# Patient Record
Sex: Female | Born: 1970 | Race: White | Hispanic: No | Marital: Married | Smoking: Never smoker
Health system: Southern US, Community
[De-identification: ages and names within clinical notes are randomized; demographics above are authoritative.]

## PROBLEM LIST (undated history)

## (undated) DIAGNOSIS — F329 Major depressive disorder, single episode, unspecified: Secondary | ICD-10-CM

## (undated) DIAGNOSIS — N921 Excessive and frequent menstruation with irregular cycle: Secondary | ICD-10-CM

## (undated) DIAGNOSIS — E559 Vitamin D deficiency, unspecified: Secondary | ICD-10-CM

## (undated) DIAGNOSIS — N946 Dysmenorrhea, unspecified: Secondary | ICD-10-CM

## (undated) DIAGNOSIS — N926 Irregular menstruation, unspecified: Secondary | ICD-10-CM

## (undated) DIAGNOSIS — R896 Abnormal cytological findings in specimens from other organs, systems and tissues: Principal | ICD-10-CM

## (undated) HISTORY — DX: Irregular menstruation, unspecified: N92.6

## (undated) HISTORY — DX: Abnormal cytological findings in specimens from other organs, systems and tissues: R89.6

## (undated) HISTORY — PX: WISDOM TOOTH EXTRACTION: SHX21

## (undated) HISTORY — DX: Vitamin D deficiency, unspecified: E55.9

## (undated) HISTORY — PX: TONSILLECTOMY: SUR1361

## (undated) HISTORY — DX: Dysmenorrhea, unspecified: N94.6

## (undated) HISTORY — DX: Major depressive disorder, single episode, unspecified: F32.9

## (undated) HISTORY — DX: Excessive and frequent menstruation with irregular cycle: N92.1

---

## 1998-09-06 ENCOUNTER — Other Ambulatory Visit: Admission: RE | Admit: 1998-09-06 | Discharge: 1998-09-06 | Payer: Self-pay | Admitting: *Deleted

## 1999-02-22 ENCOUNTER — Other Ambulatory Visit: Admission: RE | Admit: 1999-02-22 | Discharge: 1999-02-22 | Payer: Self-pay | Admitting: Obstetrics and Gynecology

## 1999-08-28 ENCOUNTER — Observation Stay (HOSPITAL_COMMUNITY): Admission: AD | Admit: 1999-08-28 | Discharge: 1999-08-29 | Payer: Self-pay | Admitting: *Deleted

## 1999-09-23 ENCOUNTER — Inpatient Hospital Stay (HOSPITAL_COMMUNITY): Admission: AD | Admit: 1999-09-23 | Discharge: 1999-09-26 | Payer: Self-pay | Admitting: Obstetrics and Gynecology

## 2000-03-01 ENCOUNTER — Other Ambulatory Visit: Admission: RE | Admit: 2000-03-01 | Discharge: 2000-03-01 | Payer: Self-pay | Admitting: Obstetrics and Gynecology

## 2001-03-21 ENCOUNTER — Other Ambulatory Visit: Admission: RE | Admit: 2001-03-21 | Discharge: 2001-03-21 | Payer: Self-pay | Admitting: Obstetrics and Gynecology

## 2001-10-02 ENCOUNTER — Inpatient Hospital Stay (HOSPITAL_COMMUNITY): Admission: AD | Admit: 2001-10-02 | Discharge: 2001-10-04 | Payer: Self-pay | Admitting: Obstetrics and Gynecology

## 2002-03-27 ENCOUNTER — Other Ambulatory Visit: Admission: RE | Admit: 2002-03-27 | Discharge: 2002-03-27 | Payer: Self-pay | Admitting: Obstetrics and Gynecology

## 2002-11-11 ENCOUNTER — Encounter: Admission: RE | Admit: 2002-11-11 | Discharge: 2002-11-11 | Payer: Self-pay | Admitting: Internal Medicine

## 2002-11-11 ENCOUNTER — Encounter: Payer: Self-pay | Admitting: Internal Medicine

## 2002-12-23 ENCOUNTER — Encounter: Payer: Self-pay | Admitting: Internal Medicine

## 2002-12-23 ENCOUNTER — Encounter: Admission: RE | Admit: 2002-12-23 | Discharge: 2002-12-23 | Payer: Self-pay | Admitting: Internal Medicine

## 2003-08-13 ENCOUNTER — Other Ambulatory Visit: Admission: RE | Admit: 2003-08-13 | Discharge: 2003-08-13 | Payer: Self-pay | Admitting: *Deleted

## 2003-08-13 ENCOUNTER — Other Ambulatory Visit: Admission: RE | Admit: 2003-08-13 | Discharge: 2003-08-13 | Payer: Self-pay | Admitting: Obstetrics and Gynecology

## 2004-02-19 ENCOUNTER — Inpatient Hospital Stay (HOSPITAL_COMMUNITY): Admission: AD | Admit: 2004-02-19 | Discharge: 2004-02-21 | Payer: Self-pay | Admitting: Obstetrics and Gynecology

## 2005-03-13 DIAGNOSIS — F32A Depression, unspecified: Secondary | ICD-10-CM

## 2005-03-13 DIAGNOSIS — IMO0001 Reserved for inherently not codable concepts without codable children: Secondary | ICD-10-CM

## 2005-03-13 DIAGNOSIS — N921 Excessive and frequent menstruation with irregular cycle: Secondary | ICD-10-CM

## 2005-03-13 HISTORY — DX: Excessive and frequent menstruation with irregular cycle: N92.1

## 2005-03-13 HISTORY — DX: Reserved for inherently not codable concepts without codable children: IMO0001

## 2005-03-13 HISTORY — DX: Depression, unspecified: F32.A

## 2005-05-10 ENCOUNTER — Other Ambulatory Visit: Admission: RE | Admit: 2005-05-10 | Discharge: 2005-05-10 | Payer: Self-pay | Admitting: Obstetrics and Gynecology

## 2006-03-13 DIAGNOSIS — E559 Vitamin D deficiency, unspecified: Secondary | ICD-10-CM

## 2006-03-13 HISTORY — DX: Vitamin D deficiency, unspecified: E55.9

## 2006-05-28 ENCOUNTER — Ambulatory Visit: Payer: Self-pay | Admitting: Family Medicine

## 2006-05-30 ENCOUNTER — Ambulatory Visit: Payer: Self-pay | Admitting: Family Medicine

## 2006-05-30 LAB — CONVERTED CEMR LAB
ALT: 13 units/L (ref 0–40)
AST: 22 units/L (ref 0–37)
Albumin: 3.8 g/dL (ref 3.5–5.2)
Alkaline Phosphatase: 46 units/L (ref 39–117)
BUN: 14 mg/dL (ref 6–23)
Basophils Absolute: 0.1 10*3/uL (ref 0.0–0.1)
Basophils Relative: 1.2 % — ABNORMAL HIGH (ref 0.0–1.0)
Bilirubin, Direct: 0.1 mg/dL (ref 0.0–0.3)
CO2: 29 meq/L (ref 19–32)
Calcium: 9 mg/dL (ref 8.4–10.5)
Chloride: 106 meq/L (ref 96–112)
Creatinine, Ser: 0.8 mg/dL (ref 0.4–1.2)
Eosinophils Absolute: 0.1 10*3/uL (ref 0.0–0.6)
Eosinophils Relative: 1.4 % (ref 0.0–5.0)
Free T4: 0.5 ng/dL — ABNORMAL LOW (ref 0.6–1.6)
GFR calc Af Amer: 106 mL/min
GFR calc non Af Amer: 87 mL/min
Glucose, Bld: 96 mg/dL (ref 70–99)
HCT: 36.1 % (ref 36.0–46.0)
Hemoglobin: 12.8 g/dL (ref 12.0–15.0)
Lymphocytes Relative: 22.6 % (ref 12.0–46.0)
MCHC: 35.5 g/dL (ref 30.0–36.0)
MCV: 88.5 fL (ref 78.0–100.0)
Monocytes Absolute: 0.5 10*3/uL (ref 0.2–0.7)
Monocytes Relative: 10.6 % (ref 3.0–11.0)
Neutro Abs: 3.1 10*3/uL (ref 1.4–7.7)
Neutrophils Relative %: 64.2 % (ref 43.0–77.0)
Platelets: 258 10*3/uL (ref 150–400)
Potassium: 4.5 meq/L (ref 3.5–5.1)
RBC: 4.08 M/uL (ref 3.87–5.11)
RDW: 11.7 % (ref 11.5–14.6)
Sodium: 141 meq/L (ref 135–145)
T3, Free: 3.5 pg/mL (ref 2.3–4.2)
TSH: 0.89 microintl units/mL (ref 0.35–5.50)
Total Bilirubin: 0.7 mg/dL (ref 0.3–1.2)
Total Protein: 7.1 g/dL (ref 6.0–8.3)
WBC: 4.9 10*3/uL (ref 4.5–10.5)

## 2008-03-13 DIAGNOSIS — N926 Irregular menstruation, unspecified: Secondary | ICD-10-CM

## 2008-03-13 HISTORY — DX: Irregular menstruation, unspecified: N92.6

## 2008-04-09 ENCOUNTER — Encounter: Admission: RE | Admit: 2008-04-09 | Discharge: 2008-04-09 | Payer: Self-pay | Admitting: Obstetrics and Gynecology

## 2009-03-13 DIAGNOSIS — N946 Dysmenorrhea, unspecified: Secondary | ICD-10-CM

## 2009-03-13 HISTORY — DX: Dysmenorrhea, unspecified: N94.6

## 2010-07-29 NOTE — H&P (Signed)
[facility]  Patient:    Lindsay Bradley, Lindsay Bradley                     MRN: [id number] Attending:  Georgina Peer, M.D. Dictator:   Wynelle Bourgeois, C.N.M.                         History and Physical  HISTORY OF PRESENT ILLNESS:   Lindsay Bradley is a 40 year old married white female at 35-6/7 weeks who presents this morning with complaint of painful uterine contractions since 4 p.m.  She denies rupture of membranes or bleeding and reports positive fetal movement.  She has been followed by the C.N.M. service at [facility] since [redacted] weeks gestation.  Her pregnancy has been remarkable for:  #1 - Long cycles, #2 - family history of ovarian cancer, #3 - preterm labor, #4 - GBS negative.  OBSTETRICAL HISTORY:          Unremarkable.  OBSTETRICAL LABORATORY DATA:  Hemoglobin 12.3, hematocrit 35.7, platelets 321,000.  Blood type O-positive.  Antibody screen negative.  RPR nonreactive. Rubella titer immune.  HBsAg negative.  Pap test within normal limits.  MSAFP within normal limits.  Glucose challenge 143.  Three-hour glucose tolerance test was within normal limits and group B strep was negative.  MEDICAL HISTORY:              Remarkable for history of long menstrual cycles.  SURGICAL HISTORY:             Remarkable for tonsillectomy and wisdom teeth extraction in 1992.  FAMILY HISTORY:               Remarkable for patients father with heart disease, hypertension and adult-onset diabetes, controlled with oral medications.  Mother with ovarian cancer, treated with chemotherapy.  Paternal aunt with breast cancer.  Maternal uncle with bipolar and manic depression, on medications.  GENETIC HISTORY:              Patients father is color blind; otherwise, genetic history is unremarkable.  SOCIAL HISTORY:               Patient is married to [name], who is involved and supportive.  They are of the [employer].  She works as a Human resources officer.  She denies any  alcohol, tobacco or illicit drug use.  PHYSICAL EXAMINATION:  VITAL SIGNS:                  Vital signs are stable.  Patient is afebrile. Blood pressure is 136/85.  SKIN:                         Within normal limits.  HEENT:                        Within normal limits.  NECK:                         Thyroid normal, not enlarged.  No masses.  CHEST:                        Clear to auscultation bilaterally.  HEART:                        Regular rate and rhythm.  No murmur.  ABDOMEN:  Soft, nontender.  Gravid at 36 cm.  Vertex to [place].  EFW is approximately 6 to 6-1/2 pounds.  Fetal monitoring reveals fetal heart tones in 120s to 130s, with average variability, positive accelerations, no decelerations.  Uterine contractions are noted at every two to four minutes, moderate to strong in intensity.  PELVIC:                       Cervical exam is 2 cm, 60-70% effaced and 0 to -1 station with a vertex presentation and a bulging bag of waters.  EXTREMITIES:                  Within normal limits with a negative Homans.  ASSESSMENT:                   1. Intrauterine pregnancy at 35-6/7 weeks.                               2. Group B streptococcus negative.                               3. Preterm labor with cervical change.  PLAN:                         1. Dr. Georgina Peer was consulted.                               2. Admit to birthing suites for labor.                               3. Routine C.N.M. orders.                               4. Expectant management.                               5. NICU aware of admission and approves                                  admission. DD:  08/29/99 TD:  08/29/99 Job: [id number]

## 2010-07-29 NOTE — Discharge Summary (Signed)
[facility] of [facility]  Patient:    Lindsay Bradley, Lindsay Bradley                     MRN: [id number] Adm. Date:  [id number] Disc. Date: [id number] Attending:  Pleas Koch Dictator:   Nigel Bridgeman, C.N.M.                           Discharge Summary  ADMITTING DIAGNOSES:          1. Intrauterine pregnancy at 35-6/7 weeks.                               2. Questionable labor.  DISCHARGE DIAGNOSES:          1. Intrauterine pregnancy at 3-6/7 weeks.                               2. Prodromal labor.  PROCEDURE:                    Electronic fetal monitoring.  HOSPITAL COURSE:              The patient is a 40 year old gravida 1, para 0 at 35-6/7 weeks who presented to the hospital right after midnight on August 29, 1999 with uterine contractions since 4;00 p.m. yesterday. SHe denies any leaking or bleeding and reported positive fetal movements.  She has been followed at [facility] by the [facility] service.  Her pregnancy has been remarkable for 1) long cycles, 2) family history of ovarian cysts, 3) beta Strep negative and 4) preterm gestation.  On admission, the cervix was 2, 60% to 70%, vertex at -1 to 0 station with bulging bag of water.  Uterine contractions were every 2-4 minutes, moderate to strong in quality.  Fetal heart rate was reactive.  The patient was admitted for labor care.  Throughout the rest of the night, her contractions remained every 2-4 minutes.  She denied any need for pain medication.  Fetal heart rate remained reactive.  The patient was reevaluated at approximately 4:30 a.m.  Contractions were beginning to space out slightly.  The cervix was 3+, 70% to 80%, vertex at -1 to 0 station with some ______ noted.  The patients contractions then spaced out to every 5-8 minutes.  The cervix was unchanged.  Plan was made to evaluate the patient after ambulation.  Fetal heart rate was reactive during the time.  Vital signs were stable.  The patient was  afebrile.  I reviewed with the patient the issue of no recommendation for augmentation if the cervical dilatation did not advance.  The patient was agreeable with that plan.  After approximately 2-3 hours of ambulation, the patient was reevaluated.  The cervix was unchanged.  Uterine contractions were now every 9-10 minutes with resting.  They were more frequent slightly with walking. Fetal heart rate was reactive.  I reviewed with the patient the issue of lack of cervical change and probable prodromal labor.  I also discussed with her the recommendation against augmentation or artificial rupture of membranes, given her 35-6/[redacted] weeks gestation.  The patient and her husband were agreeable with the plan.  The patient was discharged to home with Ambien for therapeutic sedation.  DISCHARGE INSTRUCTIONS:       Per antenatal discharge handout.  DISCHARGE MEDICATIONS:  Ambien 10 mg, one p.o. p.r.n.  DISCHARGE FOLLOW UP:          Per increased labor status or rupture of membranes or p.r.n. DD:  08/29/99 TD:  08/31/99 Job: [id number]

## 2010-07-29 NOTE — Discharge Summary (Signed)
[facility] of [facility]  Patient:    Lindsay Bradley, Lindsay Bradley                     MRN: [id number] Adm. Date:  [id number] Disc. Date: 09/26/99 Attending:  Shaune Spittle Dictator:   Vance Gather Duplantis, C.N.M.                           Discharge Summary  ADMISSION DIAGNOSES:          1. Intrauterine pregnancy at term.                               2. Early active labor.                               3. Negative group B strep.  DISCHARGE DIAGNOSES:          1. Intrauterine pregnancy at term.                               2. Early active labor.                               3. Negative group B strep.                               4. Protracted second stage, status post vacuum                                  extraction-assisted delivery.                               5. Breast feeding.                               6. Desires oral contraceptives.  PROCEDURES THIS ADMISSION:    Vacuum-assisted vaginal delivery of a viable female infant named Lindsay Bradley who weighed 7 pounds 7 ounces and had Apgars of 9 and 9 on September 24, 1999 by Dr. Dierdre Forth.  HOSPITAL COURSE:              [name] is a 40 year old married white female gravida 1 para 0 at 86 and three-sevenths weeks who was admitted in early active labor and progressed steadily to complete at approximately 8:45 p.m.  At that point, she pushed for approximately two hours with minimal descent and was recommended to obtain an epidural bolus and rest, which she did for about an hour, at which point her pressure and desire to push had returned, and she pushed again for several hours.  Following approximately three hours more of pushing, she had brought the vertex to a +3 station, but at this point was exhausted and unable to continue pushing actively with effecting any descent.  She was agreeable to have Dr. Dierdre Forth be consulted regarding an assisted vaginal delivery versus C section, and Dr. Dierdre Forth came and evaluated the patient, and offered a vacuum-assisted delivery versus C section, and they elected to proceed  with a vacuum-assisted delivery.  She was then delivered of a viable female infant named Lindsay Bradley, who weighed 7 pounds 7 ounces and had Apgars of 9 and 9, with Dr. Dierdre Forth attending the delivery.  Please see Delivery Note for details.  Postpartum, she has done well.  She is ambulating, voiding, and eating without difficulty.  She is breast feeding, also, without difficulty. Her vital signs are stable, and she is afebrile.  She desired oral contraceptives for birth control.  She is deemed ready for discharge today.  DISCHARGE INSTRUCTIONS:       As per the [facility] OB/GYN handout.  DISCHARGE MEDICATIONS:        1. Motrin 600 mg p.o. q.6h. p.r.n. pain.                               2. Tylenol No. 3 1-2 p.o. q.4-6h. p.r.n. pain.                               3. Micronor 1 p.o. q.d.                               4. Prenatal vitamins daily.                               5. Ferrous sulfate daily.                               6. Colace b.i.d.  DISCHARGE LABORATORY:         Hemoglobin is 8.5, her platelets are 144, and her wbc count is 8.8.  DISCHARGE FOLLOW-UP:           In six weeks at [facility] or p.r.n. DD:  09/26/99 TD:  09/26/99 Job: [id number]

## 2010-07-29 NOTE — H&P (Signed)
NAMEYEKATERINA, ESCUTIA            ACCOUNT NO.:  [id number]   MEDICAL RECORD NO.:  [id number]          PATIENT TYPE:  INP   LOCATION:  9169                          FACILITY:  [facility]   PHYSICIAN:  Hal Morales, M.D.DATE OF BIRTH:  26-Jan-1971   DATE OF ADMISSION:  02/19/2004  DATE OF DISCHARGE:                                HISTORY & PHYSICAL   HISTORY OF PRESENT ILLNESS:  Ms. Neiswonger is a 40 year old, gravida 3, para  2-0-0-2 at 37-1/7th weeks, who presented with uterine contractions every  three to four minutes since approximately 2 p.m.  The cervix has been 3 cm  in the office.  She reports positive fetal movement.  Beta strep is pending  as of February 18, 2004 performance.   Pregnancy has been remarkable for family history of ovarian cancer, long  cycles, history of back pains in the past.   PRENATAL LABORATORY DATA:  Blood type is O positive.  Rh antibody negative.  VDRL nonreactive.  Rubella titer positive.  Hepatitis B surface-antigen  negative.  HIV nonreactive.  Cystic fibrosis testing negative.  GC and  Chlamydia cultures were negative in the first trimester.  Pap was normal.  GC, Chlamydia, and group B strep cultures were performed on February 18, 2004  but cultures are still pending.  Hemoglobin upon entering the practice was  12.8.  It was within normal limits at 28 weeks.   PREGNANCY HISTORY:  The patient entered care at approximately 10 weeks.  She  had history of back pain and sacroiliac joint problems in the past.  [facility]  for referral was considered but was deferred at present per patient's  request.  The patient had an ultrasound at 11 weeks for dating purposes  which gave an [facility] of March 10, 2004.  She declined quadruple screen.  She  had lots of nausea through her pregnancy.  Reglan was initiated.  The  patient also had Ambien prescribed secondary to short-term insomnia.  She  had another ultrasound at 18 weeks showing a partial placenta previa.  All  other  findings were normal.  At 22 weeks, she had noted ultrasound in which  previa was resolved.  She had a normal Glucola.  The rest of her pregnancy  was essentially uncomplicated.   OBSTETRICAL HISTORY:  In 2001, she had a vaginal vacuum-assisted vaginal  birth of a female infant, weight 7 pounds 7 ounces at 39 weeks.  She was in  labor 13 hours.  She had epidural anesthesia.  She had a prolonged second  stage and a third degree laceration.   In 2003, she had a vaginal birth of a female infant, weighed 8 pounds at 37-  5/7th weeks.  She was in labor six hours.  She had epidural anesthesia.   PAST MEDICAL HISTORY:  She is a previous __________ user.  She reports the  usual childhood illnesses.  She had a history of back pain before pregnancy  and during pregnancy.  Her only other hospitalization was for childbirth x  2.   PAST SURGICAL HISTORY:  Tonsils and wisdom teeth removed in the past.  ALLERGIES:  She is allergic to SULFA.   FAMILY HISTORY:  Her father had heart disease and hypertension.  Her father  is a diabetic on oral medication.  Her mother has had ovarian cancer x 2,  currently in remission.   GENETIC HISTORY:  Unremarkable.   SOCIAL HISTORY:  The patient is married to the father of the baby.  He is  involved and supportive.  His name is Danyla Wattley.  The patient has a  graduate school education.  She is a Futures trader.  Her husband is also  graduate educated.  She has been followed by the Certified Nurse [facility].  She denies any alcohol, drug, or tobacco use during  this pregnancy.   PHYSICAL EXAMINATION:  VITAL SIGNS:  Stable.  The patient is afebrile.  HEENT:  Within normal limits.  LUNGS:  Clear.  HEART:  Regular rate and rhythm without murmur.  BREASTS:  Soft and nontender.  ABDOMEN:  Fundal height is approximately 37 cm.  Estimated fetal weight is 7  pounds.  Uterine contractions are every 3 minutes, 60 seconds in duration,  moderate quality.   PELVIC:  Cervix is 4 to 5 cm, 80% vertex, and a -1 station.  Bulging bag of  water.  EXTREMITIES:  Deep tendon reflexes are 2+ without clonus.  There is a trace  edema noted.   IMPRESSION:  1.  Intrauterine pregnancy at 37-1/7th weeks.  2.  Active labor.  3.  Group B strep cultures pending from February 18, 2004.   PLAN:  1.  Admit to birthing suite per consult with Dr. Hal Morales as      attending physician.  2.  Routine certified nurse midwife orders.  3.  We will plan group B strep prophylaxis with ampicillin for prophylaxis      after reviewing status with the patient.  4.  Epidural p.r.n.     Vick   VLL/MEDQ  D:  02/19/2004  T:  02/19/2004  Job:  [id number]

## 2010-07-29 NOTE — Op Note (Signed)
[facility] of [facility]  Patient:    Lindsay Bradley, Lindsay Bradley                     MRN: [id number] Proc. Date: 09/24/99 Adm. Date:  [id number] Attending:  Dierdre Forth [place]                           Operative Report  PREOPERATIVE DIAGNOSIS:       Intrauterine pregnancy at term, prolonged second stage of labor.  POSTOPERATIVE DIAGNOSIS:      Intrauterine pregnancy at term, prolonged second stage of labor.  Nuchal cord x 1.  OPERATION:                    Vacuum-assisted vaginal delivery over intact perineum and repair of left vaginal sulcus laceration, repair of third degree perineal laceration.  SURGEON:                      Vanessa P. Haygood, M.D.  ASSISTANT:  ANESTHESIA:                   Epidural.  ESTIMATED BLOOD LOSS:         750 cc.  COMPLICATIONS:                None.  FINDINGS:                     The patient was delivered of a female infant whose ame is Lane with Apgars of 9 and 9 at one and five minutes, respectively.  The weight was pending at the time of dictation.  DESCRIPTION OF PROCEDURE:     The patient had been pushing for approximately three hours after having rested from a 1-1/2 hour pushing episode.  During the entire  time, the fetal heart rate remained reassuring and the patient made slow, but steady progress.  At this point, the patient was significantly fatigued and wished to exercise options for delivery.  A discussion was held with the patient and her husband concerning the options of vacuum-assisted vaginal delivery versus cesarean section and the risks and benefits of each reviewed.  The risks of vacuum-assisted vaginal delivery were listed as the risk of cephalohematoma, the severity of which could very from completely asymptomatic to severe, and the risk of damage to maternal tissues.  The risks of cesarean section were listed as anesthesia, bleeding, infection, and damage to adjacent organs.  After consideration,  the patient and her husband opted for vacuum-assisted vaginal delivery.  The patient had her labor epidural in place and was in the lithotomy position. The perineum was prepped and draped.  The bladder was emptied with a red Robinson catheter.  The Mity-Vac vacuum extractor was then placed over the fetal head which was noted to be in the +3 position and only slightly LOA.  Over the next two contractions, the vertex was delivered over the intact perineum with a combination of gentle traction and maternal pushing efforts.  The vacuum was released between contractions.  The nuchal cord was divided between two Kelly clamps and the remainder of the infant delivered with gentle traction and maternal expulsive efforts.  The cord was clamped and cut and the infant given to the mother for initiation of bonding.  The appropriate cord blood was drawn and the placenta noted to release with removal with gentle traction and maternal pushing.  The cervix as  without lesions or lacerations.  There was noted to be a left vaginal side wall  laceration and a third degree perineal laceration.  These were closed in a layered fashion with the vaginal sulcus laceration being closed first and the muscle layer of the third degree perineal laceration closed in an interrupted fashion, then he soft tissues of the vaginal mucosa, the submucosa, and a subcuticular suture used to complete the repair.  All sutures were 3-0 Vicryl.  At this time, all sponges were removed from the operative field and the perineum cleansed and an icepack placed.  The mother and infant were left for initial bonding.  The mother tolerated the delivery and repair well. DD:  09/24/99 TD:  09/24/99 Job: [id number]

## 2010-07-29 NOTE — H&P (Signed)
[facility] of [facility]  Patient:    Lindsay Bradley, Lindsay Bradley                     MRN: [id number] Adm. Date:  [id number] Disc. Date: [id number] Attending:  Pleas Koch Dictator:   Vance Gather Duplantis, C.N.M.                         History and Physical  DATE OF BIRTH:                    06-28-1970  HISTORY OF PRESENT ILLNESS:       Ms. Lindsay Bradley is a 40 year old married white female, gravida 1, para 0, at 39-3/7 weeks who presents complaining of uterine contractions every 3 to 4 minutes for the last several hours.  She reports regular uterine contractions on and off over the last several days with an increase in intensity today.  She reports small amount of bloody show but no leaking of fluid.  She reports positive fetal movement.  She denies any nausea, vomiting, headaches, or visual disturbances.  Her pregnancy has been followed at [facility] by the certified nurse midwife service and has been essentially uncomplicated though at risk for history of long cycles and family history of ovarian cancer.  Her group B strep is negative.  OBSTETRICAL/GYNECOLOGIC HISTORY:  She is a primigravida.  Her last menstrual period was December 05, 1998, which gave her an [facility] of September 11, 1999, and her [facility] by ultrasound was September 27, 1999.  ALLERGIES:                        She is allergic to SULFA drugs which give her nausea, vomiting, and headaches.  PAST MEDICAL HISTORY:             She reports having had the usual childhood diseases.  She denies any other medical problems.  PAST SURGICAL HISTORY:            Her only surgery was tonsillectomy and wisdom teeth in 1992.  FAMILY HISTORY:                   Significant for her father with heart disease, hypertension, and diabetes requiring oral medication.  Her mother had a history of ovarian cancer and has been treated with chemotherapy.  GENETIC HISTORY:                  Negative except for the fact that her  father is color blind.  SOCIAL HISTORY:                   She is married to [employer] who is involved and supportive.  They are both employed full time.  She is employed as a Doctor, general practice.  They are of the [employer].  They deny any illicit drug use, alcohol, or smoking with this pregnancy.  PRENATAL LABORATORY DATA:         Her blood type is O positive.  Her antibody screen is negative.  Syphilis is nonreactive.  Rubella is immune.  Hepatitis B surface antigen is negative.  Pap was within normal limits.  Maternal serum alpha-fetoprotein was within normal range.  Her 1-hour glucola was 143, but a 3-hour GTT was within normal limits, and her 36-week beta strep was negative.  PHYSICAL EXAMINATION:  VITAL SIGNS:  On entry to hospital, blood pressure was 140s/91.  Repeat was 132/84.  HEENT:                            Grossly within normal limits.  HEART:                            Regular rhythm and rate.  CHEST:                            Clear.  BREASTS:                          Soft and nontender.  ABDOMEN:                          Gravid.  Uterine contractions every 3 minutes.  Fetal heart rate is reactive and reassuring.  PELVIC:                           Exam is 4+ cm, 100% vertex, 0 station, with bulging membranes in mid position.  EXTREMITIES:                      Within normal limits with 2+ reflexes and 1 beat of clonus.  ASSESSMENT:                       1. Intrauterine pregnancy at term.                                   2. Early active labor.                                   3. Negative group B Streptococcus.  PLAN:                             Admit to labor and delivery.  Follow routine CNM orders, and Dr. Dierdre Forth has been notified of patients admission. DD:  09/23/99 TD:  09/23/99 Job: [id number]

## 2011-10-14 ENCOUNTER — Other Ambulatory Visit: Payer: Self-pay | Admitting: Obstetrics and Gynecology

## 2011-10-16 NOTE — Telephone Encounter (Signed)
Rx for Microgestin e-pres to pharm on file. AEX sched 10/24/11 with vph.

## 2011-10-18 DIAGNOSIS — N926 Irregular menstruation, unspecified: Secondary | ICD-10-CM | POA: Insufficient documentation

## 2011-10-18 DIAGNOSIS — F329 Major depressive disorder, single episode, unspecified: Secondary | ICD-10-CM | POA: Insufficient documentation

## 2011-10-18 DIAGNOSIS — IMO0001 Reserved for inherently not codable concepts without codable children: Secondary | ICD-10-CM

## 2011-10-18 DIAGNOSIS — N946 Dysmenorrhea, unspecified: Secondary | ICD-10-CM | POA: Insufficient documentation

## 2011-10-18 DIAGNOSIS — F32A Depression, unspecified: Secondary | ICD-10-CM | POA: Insufficient documentation

## 2011-10-18 DIAGNOSIS — N921 Excessive and frequent menstruation with irregular cycle: Secondary | ICD-10-CM | POA: Insufficient documentation

## 2011-10-18 DIAGNOSIS — E559 Vitamin D deficiency, unspecified: Secondary | ICD-10-CM | POA: Insufficient documentation

## 2011-10-24 ENCOUNTER — Encounter: Payer: Self-pay | Admitting: Obstetrics and Gynecology

## 2011-10-24 ENCOUNTER — Ambulatory Visit (INDEPENDENT_AMBULATORY_CARE_PROVIDER_SITE_OTHER): Payer: BC Managed Care – PPO | Admitting: Obstetrics and Gynecology

## 2011-10-24 VITALS — BP 116/68 | Ht 62.5 in | Wt 113.0 lb

## 2011-10-24 DIAGNOSIS — IMO0001 Reserved for inherently not codable concepts without codable children: Secondary | ICD-10-CM

## 2011-10-24 DIAGNOSIS — N946 Dysmenorrhea, unspecified: Secondary | ICD-10-CM

## 2011-10-24 DIAGNOSIS — R87610 Atypical squamous cells of undetermined significance on cytologic smear of cervix (ASC-US): Secondary | ICD-10-CM

## 2011-10-24 DIAGNOSIS — Z124 Encounter for screening for malignant neoplasm of cervix: Secondary | ICD-10-CM

## 2011-10-24 DIAGNOSIS — N921 Excessive and frequent menstruation with irregular cycle: Secondary | ICD-10-CM

## 2011-10-24 NOTE — Addendum Note (Signed)
Addended by: Lerry Liner D on: 10/24/2011 03:55 PM   Modules accepted: Orders

## 2011-10-24 NOTE — Progress Notes (Signed)
Last Pap: 10/04/2010 WNL: Yes Regular Periods:yes Contraception: BC Pills  Monthly Breast exam:yes Tetanus<77yrs: unsure Nl.Bladder Function:yes Daily BMs:yes Healthy Diet:yes Calcium:no Mammogram:no Date of Mammogram: n/a Exercise:yes Have often Exercise: 3 times weekly Seatbelt: yes Abuse at home: no Stressful work:no Sigmoid-colonoscopy: n/a Bone Density: No PCP: [facility]  Change in PMH: None Change in [facility]: None Subjective:    Lindsay Bradley is a 41 y.o. female, G3P3, who presents for an annual exam. Menorrhagia and dysmenorrhea well managed with extended cycle BCP's   History   Social History  . Marital Status: Single    Spouse Name: N/A    Number of Children: N/A  . Years of Education: N/A   Social History Main Topics  . Smoking status: Never Smoker   . Smokeless tobacco: Never Used  . Alcohol Use: No  . Drug Use: No  . Sexually Active: Yes    Birth Control/ Protection: Pill   Other Topics Concern  . None   Social History Narrative  . None    Menstrual cycle:   LMP: Patient's last menstrual period was 10/07/2011.           Cycle: every 3 months per BCPs  The following portions of the patient's history were reviewed and updated as appropriate: allergies, current medications, past family history, past medical history, past social history, past surgical history and problem list.  Review of Systems Pertinent items are noted in HPI. Breast:Negative for breast lump,nipple discharge or nipple retraction Gastrointestinal: Negative for abdominal pain, change in bowel habits or rectal bleeding Urinary:negative   Objective:    BP 116/68  Ht 5' 2.5" (1.588 m)  Wt 113 lb (51.256 kg)  BMI 20.34 kg/m2  LMP 10/07/2011    Weight:  Wt Readings from Last 1 Encounters:  10/24/11 113 lb (51.256 kg)          BMI: Body mass index is 20.34 kg/(m^2).  General Appearance: Alert, appropriate appearance for age. No acute distress HEENT: Grossly normal Neck  / Thyroid: Supple, no masses, nodes or enlargement Lungs: clear to auscultation bilaterally Back: No CVA tenderness Breast Exam: No masses or nodes.No dimpling, nipple retraction or discharge. Cardiovascular: Regular rate and rhythm. S1, S2, no murmur Gastrointestinal: Soft, non-tender, no masses or organomegaly Pelvic Exam: Vulva and vagina appear normal. Bimanual exam reveals normal uterus and adnexa. Rectovaginal: normal rectal, no masses Lymphatic Exam: Non-palpable nodes in neck, clavicular, axillary, or inguinal regions Skin: no rash or abnormalities Neurologic: Normal gait and speech, no tremor  Psychiatric: Alert and oriented, appropriate affect.   Wet Prep:not applicable Urinalysis:not applicable UPT: Not done   Assessment:    Menorrhagia and dysmenorrhea, improved    Plan:    mammogram pap smear return annually or prn Continue extended cycle BCPs STD screening: declined Contraception:oral contraceptives (estrogen/progesterone)      Lindsay Bradley PMD

## 2011-10-26 ENCOUNTER — Telehealth: Payer: Self-pay | Admitting: Obstetrics and Gynecology

## 2011-10-26 ENCOUNTER — Other Ambulatory Visit: Payer: Self-pay

## 2011-10-26 DIAGNOSIS — Z1231 Encounter for screening mammogram for malignant neoplasm of breast: Secondary | ICD-10-CM

## 2011-10-26 NOTE — Telephone Encounter (Signed)
Orders in for mammogram 

## 2011-10-27 LAB — PAP IG W/ RFLX HPV ASCU

## 2011-11-20 ENCOUNTER — Ambulatory Visit
Admission: RE | Admit: 2011-11-20 | Discharge: 2011-11-20 | Disposition: A | Discharge status: Home / Self Care | Payer: BC Managed Care – PPO | Source: Ambulatory Visit | Attending: Obstetrics and Gynecology | Admitting: Obstetrics and Gynecology

## 2011-11-20 DIAGNOSIS — Z1231 Encounter for screening mammogram for malignant neoplasm of breast: Secondary | ICD-10-CM

## 2013-02-18 ENCOUNTER — Other Ambulatory Visit: Payer: Self-pay

## 2013-02-18 DIAGNOSIS — Z1231 Encounter for screening mammogram for malignant neoplasm of breast: Secondary | ICD-10-CM

## 2013-03-19 ENCOUNTER — Ambulatory Visit
Admission: RE | Admit: 2013-03-19 | Discharge: 2013-03-19 | Disposition: A | Discharge status: Home / Self Care | Payer: BC Managed Care – PPO | Source: Ambulatory Visit

## 2013-03-19 DIAGNOSIS — Z1231 Encounter for screening mammogram for malignant neoplasm of breast: Secondary | ICD-10-CM

## 2014-01-12 ENCOUNTER — Encounter: Payer: Self-pay | Admitting: Obstetrics and Gynecology

## 2014-02-16 ENCOUNTER — Other Ambulatory Visit: Payer: Self-pay

## 2014-02-16 DIAGNOSIS — Z1231 Encounter for screening mammogram for malignant neoplasm of breast: Secondary | ICD-10-CM

## 2014-03-30 ENCOUNTER — Encounter (INDEPENDENT_AMBULATORY_CARE_PROVIDER_SITE_OTHER): Payer: Self-pay

## 2014-03-30 ENCOUNTER — Ambulatory Visit
Admission: RE | Admit: 2014-03-30 | Discharge: 2014-03-30 | Disposition: A | Discharge status: Home / Self Care | Payer: BC Managed Care – PPO | Source: Ambulatory Visit

## 2014-03-30 DIAGNOSIS — Z1231 Encounter for screening mammogram for malignant neoplasm of breast: Secondary | ICD-10-CM

## 2015-03-01 ENCOUNTER — Other Ambulatory Visit: Payer: Self-pay

## 2015-03-01 DIAGNOSIS — Z1231 Encounter for screening mammogram for malignant neoplasm of breast: Secondary | ICD-10-CM

## 2015-03-30 ENCOUNTER — Emergency Department (HOSPITAL_BASED_OUTPATIENT_CLINIC_OR_DEPARTMENT_OTHER): Payer: BC Managed Care – PPO

## 2015-03-30 ENCOUNTER — Encounter (HOSPITAL_BASED_OUTPATIENT_CLINIC_OR_DEPARTMENT_OTHER): Payer: Self-pay | Admitting: *Deleted

## 2015-03-30 ENCOUNTER — Emergency Department (HOSPITAL_BASED_OUTPATIENT_CLINIC_OR_DEPARTMENT_OTHER)
Admission: EM | Admit: 2015-03-30 | Discharge: 2015-03-30 | Disposition: A | Discharge status: Home / Self Care | Payer: BC Managed Care – PPO | Attending: Emergency Medicine | Admitting: Emergency Medicine

## 2015-03-30 DIAGNOSIS — Z79899 Other long term (current) drug therapy: Secondary | ICD-10-CM | POA: Insufficient documentation

## 2015-03-30 DIAGNOSIS — Z8659 Personal history of other mental and behavioral disorders: Secondary | ICD-10-CM | POA: Diagnosis not present

## 2015-03-30 DIAGNOSIS — Z8639 Personal history of other endocrine, nutritional and metabolic disease: Secondary | ICD-10-CM | POA: Insufficient documentation

## 2015-03-30 DIAGNOSIS — Y998 Other external cause status: Secondary | ICD-10-CM | POA: Diagnosis not present

## 2015-03-30 DIAGNOSIS — Y92410 Unspecified street and highway as the place of occurrence of the external cause: Secondary | ICD-10-CM | POA: Insufficient documentation

## 2015-03-30 DIAGNOSIS — Z8742 Personal history of other diseases of the female genital tract: Secondary | ICD-10-CM | POA: Diagnosis not present

## 2015-03-30 DIAGNOSIS — Z3202 Encounter for pregnancy test, result negative: Secondary | ICD-10-CM | POA: Insufficient documentation

## 2015-03-30 DIAGNOSIS — S161XXA Strain of muscle, fascia and tendon at neck level, initial encounter: Secondary | ICD-10-CM | POA: Insufficient documentation

## 2015-03-30 DIAGNOSIS — S199XXA Unspecified injury of neck, initial encounter: Secondary | ICD-10-CM | POA: Diagnosis present

## 2015-03-30 DIAGNOSIS — Y9389 Activity, other specified: Secondary | ICD-10-CM | POA: Diagnosis not present

## 2015-03-30 DIAGNOSIS — S39012A Strain of muscle, fascia and tendon of lower back, initial encounter: Secondary | ICD-10-CM

## 2015-03-30 LAB — PREGNANCY, URINE: Preg Test, Ur: NEGATIVE

## 2015-03-30 MED ORDER — IBUPROFEN 800 MG PO TABS: 800.0000 mg | ORAL_TABLET | Freq: Three times a day (TID) | ORAL | Status: AC | PRN

## 2015-03-30 MED ORDER — HYDROCODONE-ACETAMINOPHEN 5-325 MG PO TABS: 1.0000 | ORAL_TABLET | Freq: Four times a day (QID) | ORAL | Status: AC | PRN

## 2015-03-30 MED ORDER — IBUPROFEN 400 MG PO TABS
600.0000 mg | ORAL_TABLET | Freq: Once | ORAL | Status: AC
  Administered 2015-03-30: 600 mg via ORAL
  Filled 2015-03-30: qty 1

## 2015-03-30 NOTE — ED Notes (Signed)
Pt verbalizes understanding of d/c instructions and denies any further needs at this time. 

## 2015-03-30 NOTE — ED Notes (Signed)
Removed c-collar, ok per PA Ebbie Ridge

## 2015-03-30 NOTE — Discharge Instructions (Signed)
Return here as needed.  Follow-up with your primary care doctor.  Your x-rays did not show any abnormalities.  Use ice and heat on your neck and back
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
## 2015-03-31 ENCOUNTER — Emergency Department (HOSPITAL_BASED_OUTPATIENT_CLINIC_OR_DEPARTMENT_OTHER): Payer: BC Managed Care – PPO

## 2015-03-31 ENCOUNTER — Encounter (HOSPITAL_BASED_OUTPATIENT_CLINIC_OR_DEPARTMENT_OTHER): Payer: Self-pay | Admitting: Emergency Medicine

## 2015-03-31 ENCOUNTER — Emergency Department (HOSPITAL_BASED_OUTPATIENT_CLINIC_OR_DEPARTMENT_OTHER)
Admission: EM | Admit: 2015-03-31 | Discharge: 2015-03-31 | Disposition: A | Discharge status: Home / Self Care | Payer: BC Managed Care – PPO | Attending: Emergency Medicine | Admitting: Emergency Medicine

## 2015-03-31 DIAGNOSIS — S060X0D Concussion without loss of consciousness, subsequent encounter: Secondary | ICD-10-CM

## 2015-03-31 DIAGNOSIS — I10 Essential (primary) hypertension: Secondary | ICD-10-CM | POA: Diagnosis not present

## 2015-03-31 DIAGNOSIS — Z8659 Personal history of other mental and behavioral disorders: Secondary | ICD-10-CM | POA: Insufficient documentation

## 2015-03-31 DIAGNOSIS — Z79899 Other long term (current) drug therapy: Secondary | ICD-10-CM | POA: Diagnosis not present

## 2015-03-31 DIAGNOSIS — K219 Gastro-esophageal reflux disease without esophagitis: Secondary | ICD-10-CM | POA: Diagnosis not present

## 2015-03-31 DIAGNOSIS — K5900 Constipation, unspecified: Secondary | ICD-10-CM | POA: Insufficient documentation

## 2015-03-31 DIAGNOSIS — Z8742 Personal history of other diseases of the female genital tract: Secondary | ICD-10-CM | POA: Diagnosis not present

## 2015-03-31 DIAGNOSIS — Z7982 Long term (current) use of aspirin: Secondary | ICD-10-CM | POA: Diagnosis not present

## 2015-03-31 DIAGNOSIS — X58XXXD Exposure to other specified factors, subsequent encounter: Secondary | ICD-10-CM | POA: Diagnosis not present

## 2015-03-31 DIAGNOSIS — Z8639 Personal history of other endocrine, nutritional and metabolic disease: Secondary | ICD-10-CM | POA: Diagnosis not present

## 2015-03-31 DIAGNOSIS — S0990XD Unspecified injury of head, subsequent encounter: Secondary | ICD-10-CM | POA: Diagnosis present
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
## 2015-03-31 MED ORDER — IBUPROFEN 800 MG PO TABS
800.0000 mg | ORAL_TABLET | Freq: Once | ORAL | Status: AC
  Administered 2015-03-31: 800 mg via ORAL
  Filled 2015-03-31: qty 1

## 2015-03-31 MED ORDER — ONDANSETRON 4 MG PO TBDP
4.0000 mg | ORAL_TABLET | Freq: Once | ORAL | Status: AC
  Administered 2015-03-31: 4 mg via ORAL
  Filled 2015-03-31: qty 1

## 2015-03-31 MED ORDER — ONDANSETRON HCL 4 MG PO TABS: 4.0000 mg | ORAL_TABLET | Freq: Four times a day (QID) | ORAL | Status: AC
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
## 2015-03-31 NOTE — ED Notes (Signed)
Gingerale given per pt request for po challenge.

## 2015-03-31 NOTE — Discharge Instructions (Signed)
Concussion, Adult  A concussion, or closed-head injury, is a brain injury caused by a direct blow to the head or by a quick and sudden movement (jolt) of the head or neck. Concussions are usually not life-threatening. Even so, the effects of a concussion can be serious. If you have had a concussion before, you are more likely to experience concussion-like symptoms after a direct blow to the head.   CAUSES  · Direct blow to the head, such as from running into another player during a soccer game, being hit in a fight, or hitting your head on a hard surface.  · A jolt of the head or neck that causes the brain to move back and forth inside the skull, such as in a car crash.  SIGNS AND SYMPTOMS  The signs of a concussion can be hard to notice. Early on, they may be missed by you, family members, and health care providers. You may look fine but act or feel differently.  Symptoms are usually temporary, but they may last for days, weeks, or even longer. Some symptoms may appear right away while others may not show up for hours or days. Every head injury is different. Symptoms include:  · Mild to moderate headaches that will not go away.  · A feeling of pressure inside your head.  · Having more trouble than usual:    Learning or remembering things you have heard.    Answering questions.    Paying attention or concentrating.    Organizing daily tasks.    Making decisions and solving problems.  · Slowness in thinking, acting or reacting, speaking, or reading.  · Getting lost or being easily confused.  · Feeling tired all the time or lacking energy (fatigued).  · Feeling drowsy.  · Sleep disturbances.    Sleeping more than usual.    Sleeping less than usual.    Trouble falling asleep.    Trouble sleeping (insomnia).  · Loss of balance or feeling lightheaded or dizzy.  · Nausea or vomiting.  · Numbness or tingling.  · Increased sensitivity to:    Sounds.    Lights.    Distractions.  · Vision problems or eyes that tire  easily.  · Diminished sense of taste or smell.  · Ringing in the ears.  · Mood changes such as feeling sad or anxious.  · Becoming easily irritated or angry for little or no reason.  · Lack of motivation.  · Seeing or hearing things other people do not see or hear (hallucinations).  DIAGNOSIS  Your health care provider can usually diagnose a concussion based on a description of your injury and symptoms. He or she will ask whether you passed out (lost consciousness) and whether you are having trouble remembering events that happened right before and during your injury.  Your evaluation might include:  · A brain scan to look for signs of injury to the brain. Even if the test shows no injury, you may still have a concussion.  · Blood tests to be sure other problems are not present.  TREATMENT  · Concussions are usually treated in an emergency department, in urgent care, or at a clinic. You may need to stay in the hospital overnight for further treatment.  · Tell your health care provider if you are taking any medicines, including prescription medicines, over-the-counter medicines, and natural remedies. Some medicines, such as blood thinners (anticoagulants) and aspirin, may increase the chance of complications. Also tell your health care   provider whether you have had alcohol or are taking illegal drugs. This information may affect treatment.  · Your health care provider will send you home with important instructions to follow.  · How fast you will recover from a concussion depends on many factors. These factors include how severe your concussion is, what part of your brain was injured, your age, and how healthy you were before the concussion.  · Most people with mild injuries recover fully. Recovery can take time. In general, recovery is slower in older persons. Also, persons who have had a concussion in the past or have other medical problems may find that it takes longer to recover from their current injury.  HOME  CARE INSTRUCTIONS  General Instructions  · Carefully follow the directions your health care provider gave you.  · Only take over-the-counter or prescription medicines for pain, discomfort, or fever as directed by your health care provider.  · Take only those medicines that your health care provider has approved.  · Do not drink alcohol until your health care provider says you are well enough to do so. Alcohol and certain other drugs may slow your recovery and can put you at risk of further injury.  · If it is harder than usual to remember things, write them down.  · If you are easily distracted, try to do one thing at a time. For example, do not try to watch TV while fixing dinner.  · Talk with family members or close friends when making important decisions.  · Keep all follow-up appointments. Repeated evaluation of your symptoms is recommended for your recovery.  · Watch your symptoms and tell others to do the same. Complications sometimes occur after a concussion. Older adults with a brain injury may have a higher risk of serious complications, such as a blood clot on the brain.  · Tell your teachers, school nurse, school counselor, coach, athletic trainer, or work manager about your injury, symptoms, and restrictions. Tell them about what you can or cannot do. They should watch for:    Increased problems with attention or concentration.    Increased difficulty remembering or learning new information.    Increased time needed to complete tasks or assignments.    Increased irritability or decreased ability to cope with stress.    Increased symptoms.  · Rest. Rest helps the brain to heal. Make sure you:    Get plenty of sleep at night. Avoid staying up late at night.    Keep the same bedtime hours on weekends and weekdays.    Rest during the day. Take daytime naps or rest breaks when you feel tired.  · Limit activities that require a lot of thought or concentration. These include:    Doing homework or job-related  work.    Watching TV.    Working on the computer.  · Avoid any situation where there is potential for another head injury (football, hockey, soccer, basketball, martial arts, downhill snow sports and horseback riding). Your condition will get worse every time you experience a concussion. You should avoid these activities until you are evaluated by the appropriate follow-up health care providers.  Returning To Your Regular Activities  You will need to return to your normal activities slowly, not all at once. You must give your body and brain enough time for recovery.  · Do not return to sports or other athletic activities until your health care provider tells you it is safe to do so.  · Ask   your health care provider when you can drive, ride a bicycle, or operate heavy machinery. Your ability to react may be slower after a brain injury. Never do these activities if you are dizzy.  · Ask your health care provider about when you can return to work or school.  Preventing Another Concussion  It is very important to avoid another brain injury, especially before you have recovered. In rare cases, another injury can lead to permanent brain damage, brain swelling, or death. The risk of this is greatest during the first 7-10 days after a head injury. Avoid injuries by:  · Wearing a seat belt when riding in a car.  · Drinking alcohol only in moderation.  · Wearing a helmet when biking, skiing, skateboarding, skating, or doing similar activities.  · Avoiding activities that could lead to a second concussion, such as contact or recreational sports, until your health care provider says it is okay.  · Taking safety measures in your home.    Remove clutter and tripping hazards from floors and stairways.    Use grab bars in bathrooms and handrails by stairs.    Place non-slip mats on floors and in bathtubs.    Improve lighting in dim areas.  SEEK MEDICAL CARE IF:  · You have increased problems paying attention or  concentrating.  · You have increased difficulty remembering or learning new information.  · You need more time to complete tasks or assignments than before.  · You have increased irritability or decreased ability to cope with stress.  · You have more symptoms than before.  Seek medical care if you have any of the following symptoms for more than 2 weeks after your injury:  · Lasting (chronic) headaches.  · Dizziness or balance problems.  · Nausea.  · Vision problems.  · Increased sensitivity to noise or light.  · Depression or mood swings.  · Anxiety or irritability.  · Memory problems.  · Difficulty concentrating or paying attention.  · Sleep problems.  · Feeling tired all the time.  SEEK IMMEDIATE MEDICAL CARE IF:  · You have severe or worsening headaches. These may be a sign of a blood clot in the brain.  · You have weakness (even if only in one hand, leg, or part of the face).  · You have numbness.  · You have decreased coordination.  · You vomit repeatedly.  · You have increased sleepiness.  · One pupil is larger than the other.  · You have convulsions.  · You have slurred speech.  · You have increased confusion. This may be a sign of a blood clot in the brain.  · You have increased restlessness, agitation, or irritability.  · You are unable to recognize people or places.  · You have neck pain.  · It is difficult to wake you up.  · You have unusual behavior changes.  · You lose consciousness.  MAKE SURE YOU:  · Understand these instructions.  · Will watch your condition.  · Will get help right away if you are not doing well or get worse.     This information is not intended to replace advice given to you by your health care provider. Make sure you discuss any questions you have with your health care provider.     Document Released: 05/20/2003 Document Revised: 03/20/2014 Document Reviewed: 09/19/2012  [employer] Interactive Patient Education ©2016 [employer]

## 2015-03-31 NOTE — ED Notes (Signed)
Patient given crackers and cheese - reports that she is feeling better and would like to go home
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
## 2015-04-06 ENCOUNTER — Ambulatory Visit: Payer: BC Managed Care – PPO

## 2015-05-04 ENCOUNTER — Ambulatory Visit
Admission: RE | Admit: 2015-05-04 | Discharge: 2015-05-04 | Disposition: A | Discharge status: Home / Self Care | Payer: BC Managed Care – PPO | Source: Ambulatory Visit

## 2015-05-04 DIAGNOSIS — Z1231 Encounter for screening mammogram for malignant neoplasm of breast: Secondary | ICD-10-CM

## 2016-05-04 ENCOUNTER — Other Ambulatory Visit: Payer: Self-pay | Admitting: Obstetrics and Gynecology

## 2016-05-04 DIAGNOSIS — Z1231 Encounter for screening mammogram for malignant neoplasm of breast: Secondary | ICD-10-CM

## 2016-05-24 ENCOUNTER — Ambulatory Visit
Admission: RE | Admit: 2016-05-24 | Discharge: 2016-05-24 | Disposition: A | Discharge status: Home / Self Care | Payer: BC Managed Care – PPO | Source: Ambulatory Visit | Attending: Obstetrics and Gynecology | Admitting: Obstetrics and Gynecology

## 2016-05-24 DIAGNOSIS — Z1231 Encounter for screening mammogram for malignant neoplasm of breast: Secondary | ICD-10-CM

## 2017-06-25 ENCOUNTER — Other Ambulatory Visit: Payer: Self-pay | Admitting: Obstetrics and Gynecology

## 2017-06-25 DIAGNOSIS — Z139 Encounter for screening, unspecified: Secondary | ICD-10-CM

## 2017-07-06 ENCOUNTER — Ambulatory Visit
Admission: RE | Admit: 2017-07-06 | Discharge: 2017-07-06 | Disposition: A | Discharge status: Home / Self Care | Payer: BC Managed Care – PPO | Source: Ambulatory Visit | Attending: Obstetrics and Gynecology | Admitting: Obstetrics and Gynecology

## 2017-07-06 DIAGNOSIS — Z139 Encounter for screening, unspecified: Secondary | ICD-10-CM

## 2018-08-16 ENCOUNTER — Other Ambulatory Visit: Payer: Self-pay | Admitting: Obstetrics and Gynecology

## 2018-08-16 DIAGNOSIS — Z1231 Encounter for screening mammogram for malignant neoplasm of breast: Secondary | ICD-10-CM

## 2018-10-07 ENCOUNTER — Ambulatory Visit
Admission: RE | Admit: 2018-10-07 | Discharge: 2018-10-07 | Disposition: A | Discharge status: Home / Self Care | Payer: BC Managed Care – PPO | Source: Ambulatory Visit | Attending: Obstetrics and Gynecology | Admitting: Obstetrics and Gynecology

## 2018-10-07 ENCOUNTER — Other Ambulatory Visit: Payer: Self-pay

## 2018-10-07 DIAGNOSIS — Z1231 Encounter for screening mammogram for malignant neoplasm of breast: Secondary | ICD-10-CM

## 2019-09-17 ENCOUNTER — Other Ambulatory Visit: Payer: Self-pay | Admitting: Obstetrics and Gynecology

## 2019-09-17 DIAGNOSIS — Z1231 Encounter for screening mammogram for malignant neoplasm of breast: Secondary | ICD-10-CM

## 2019-10-08 ENCOUNTER — Ambulatory Visit
Admission: RE | Admit: 2019-10-08 | Discharge: 2019-10-08 | Disposition: A | Discharge status: Home / Self Care | Payer: BC Managed Care – PPO | Source: Ambulatory Visit

## 2019-10-08 ENCOUNTER — Other Ambulatory Visit: Payer: Self-pay

## 2019-10-08 DIAGNOSIS — Z1231 Encounter for screening mammogram for malignant neoplasm of breast: Secondary | ICD-10-CM

## 2020-10-08 ENCOUNTER — Other Ambulatory Visit: Payer: Self-pay | Admitting: Obstetrics and Gynecology

## 2020-10-08 DIAGNOSIS — Z1231 Encounter for screening mammogram for malignant neoplasm of breast: Secondary | ICD-10-CM

## 2020-10-18 ENCOUNTER — Other Ambulatory Visit: Payer: Self-pay

## 2020-10-18 ENCOUNTER — Ambulatory Visit
Admission: RE | Admit: 2020-10-18 | Discharge: 2020-10-18 | Disposition: A | Discharge status: Home / Self Care | Payer: BC Managed Care – PPO | Source: Ambulatory Visit

## 2020-10-18 DIAGNOSIS — Z1231 Encounter for screening mammogram for malignant neoplasm of breast: Secondary | ICD-10-CM

## 2021-10-19 ENCOUNTER — Other Ambulatory Visit: Payer: Self-pay | Admitting: Obstetrics and Gynecology

## 2021-10-19 DIAGNOSIS — Z1231 Encounter for screening mammogram for malignant neoplasm of breast: Secondary | ICD-10-CM

## 2021-10-28 ENCOUNTER — Ambulatory Visit
Admission: RE | Admit: 2021-10-28 | Discharge: 2021-10-28 | Disposition: A | Discharge status: Home / Self Care | Payer: BC Managed Care – PPO | Source: Ambulatory Visit | Attending: Obstetrics and Gynecology | Admitting: Obstetrics and Gynecology

## 2021-10-28 DIAGNOSIS — Z1231 Encounter for screening mammogram for malignant neoplasm of breast: Secondary | ICD-10-CM

## 2021-11-01 ENCOUNTER — Other Ambulatory Visit: Payer: Self-pay | Admitting: Obstetrics and Gynecology

## 2021-11-01 DIAGNOSIS — R928 Other abnormal and inconclusive findings on diagnostic imaging of breast: Secondary | ICD-10-CM

## 2021-11-09 ENCOUNTER — Other Ambulatory Visit: Payer: Self-pay | Admitting: Obstetrics and Gynecology

## 2021-11-09 ENCOUNTER — Ambulatory Visit
Admission: RE | Admit: 2021-11-09 | Discharge: 2021-11-09 | Disposition: A | Discharge status: Home / Self Care | Payer: BC Managed Care – PPO | Source: Ambulatory Visit | Attending: Obstetrics and Gynecology | Admitting: Obstetrics and Gynecology

## 2021-11-09 DIAGNOSIS — N6489 Other specified disorders of breast: Secondary | ICD-10-CM

## 2021-11-09 DIAGNOSIS — R928 Other abnormal and inconclusive findings on diagnostic imaging of breast: Secondary | ICD-10-CM

## 2021-11-21 ENCOUNTER — Ambulatory Visit
Admission: RE | Admit: 2021-11-21 | Discharge: 2021-11-21 | Disposition: A | Discharge status: Home / Self Care | Payer: BC Managed Care – PPO | Source: Ambulatory Visit | Attending: Obstetrics and Gynecology | Admitting: Obstetrics and Gynecology

## 2021-11-21 DIAGNOSIS — N6489 Other specified disorders of breast: Secondary | ICD-10-CM

## 2022-04-06 ENCOUNTER — Other Ambulatory Visit: Payer: Self-pay | Admitting: Obstetrics and Gynecology

## 2022-04-06 DIAGNOSIS — N6489 Other specified disorders of breast: Secondary | ICD-10-CM

## 2022-05-24 ENCOUNTER — Ambulatory Visit
Admission: RE | Admit: 2022-05-24 | Discharge: 2022-05-24 | Disposition: A | Discharge status: Home / Self Care | Payer: BC Managed Care – PPO | Source: Ambulatory Visit | Attending: Obstetrics and Gynecology | Admitting: Obstetrics and Gynecology

## 2022-05-24 DIAGNOSIS — N6489 Other specified disorders of breast: Secondary | ICD-10-CM

## 2023-06-04 ENCOUNTER — Other Ambulatory Visit: Payer: Self-pay | Admitting: Obstetrics and Gynecology

## 2023-06-04 DIAGNOSIS — Z1231 Encounter for screening mammogram for malignant neoplasm of breast: Secondary | ICD-10-CM

## 2023-06-12 ENCOUNTER — Ambulatory Visit
Admission: RE | Admit: 2023-06-12 | Discharge: 2023-06-12 | Disposition: A | Discharge status: Home / Self Care | Payer: PRIVATE HEALTH INSURANCE | Source: Ambulatory Visit | Attending: Obstetrics and Gynecology | Admitting: Obstetrics and Gynecology

## 2023-06-12 DIAGNOSIS — Z1231 Encounter for screening mammogram for malignant neoplasm of breast: Secondary | ICD-10-CM
